# Patient Record
Sex: Male | Born: 1970 | Race: White | Hispanic: No | Marital: Married | Smoking: Never smoker
Health system: Southern US, Community
[De-identification: ages and names within clinical notes are randomized; demographics above are authoritative.]

## PROBLEM LIST (undated history)

## (undated) DIAGNOSIS — I4891 Unspecified atrial fibrillation: Secondary | ICD-10-CM

## (undated) DIAGNOSIS — E119 Type 2 diabetes mellitus without complications: Secondary | ICD-10-CM

## (undated) DIAGNOSIS — G629 Polyneuropathy, unspecified: Secondary | ICD-10-CM

---

## 2021-02-19 ENCOUNTER — Emergency Department (HOSPITAL_BASED_OUTPATIENT_CLINIC_OR_DEPARTMENT_OTHER): Payer: BLUE CROSS/BLUE SHIELD

## 2021-02-19 ENCOUNTER — Encounter (HOSPITAL_BASED_OUTPATIENT_CLINIC_OR_DEPARTMENT_OTHER): Payer: Self-pay | Admitting: *Deleted

## 2021-02-19 ENCOUNTER — Emergency Department (HOSPITAL_BASED_OUTPATIENT_CLINIC_OR_DEPARTMENT_OTHER)
Admission: EM | Admit: 2021-02-19 | Discharge: 2021-02-19 | Disposition: A | Discharge status: Home / Self Care | Payer: BLUE CROSS/BLUE SHIELD | Attending: Emergency Medicine | Admitting: Emergency Medicine

## 2021-02-19 ENCOUNTER — Other Ambulatory Visit: Payer: Self-pay

## 2021-02-19 DIAGNOSIS — I4891 Unspecified atrial fibrillation: Secondary | ICD-10-CM | POA: Insufficient documentation

## 2021-02-19 DIAGNOSIS — W109XXA Fall (on) (from) unspecified stairs and steps, initial encounter: Secondary | ICD-10-CM | POA: Insufficient documentation

## 2021-02-19 DIAGNOSIS — E119 Type 2 diabetes mellitus without complications: Secondary | ICD-10-CM | POA: Diagnosis not present

## 2021-02-19 DIAGNOSIS — S99921A Unspecified injury of right foot, initial encounter: Secondary | ICD-10-CM | POA: Diagnosis present

## 2021-02-19 DIAGNOSIS — S92491A Other fracture of right great toe, initial encounter for closed fracture: Secondary | ICD-10-CM

## 2021-02-19 DIAGNOSIS — W19XXXA Unspecified fall, initial encounter: Secondary | ICD-10-CM

## 2021-02-19 HISTORY — DX: Polyneuropathy, unspecified: G62.9

## 2021-02-19 HISTORY — DX: Unspecified atrial fibrillation: I48.91

## 2021-02-19 HISTORY — DX: Type 2 diabetes mellitus without complications: E11.9

## 2021-02-19 NOTE — ED Provider Notes (Signed)
[facility] EMERGENCY DEPARTMENT Provider Note   CSN: [id number] Arrival date & time: 02/19/21  1526     History Chief Complaint  Patient presents with   Toe Injury    Jonathan Murphy is a 50 y.o. male.  HPI Patient is a 50 year old male with a medical history as noted below.  He presents to the emergency department due to right great toe pain.  Patient states he missed a step and fell out of his truck bed landing on the asphalt.  States that he struck his right great toe when this occurred.  States that he was only mildly hurting initially in the toe but after ambulating on it it continued to worsen so he came to the emergency department.  Denies any head trauma.  States he is not anticoagulated and his son at bedside who is an EMT confirms this.  Denies any other regions of pain.  No numbness or weakness.    Past Medical History:  Diagnosis Date   Atrial fibrillation (HCC)    Diabetes mellitus without complication (HCC)    Neuropathy     There are no problems to display for this patient.   Past Surgical History:  Procedure Laterality Date   CARDIAC ELECTROPHYSIOLOGY MAPPING AND ABLATION         History reviewed. No pertinent family history.  Social History   Tobacco Use   Smoking status: Never   Smokeless tobacco: Never  Vaping Use   Vaping Use: Never used  Substance Use Topics   Alcohol use: Yes    Comment: rare   Drug use: Not Currently    Home Medications Prior to Admission medications   Not on File    Allergies    Patient has no known allergies.  Review of Systems   Review of Systems  Musculoskeletal:  Positive for arthralgias, joint swelling and myalgias.  Skin:  Positive for color change. Negative for wound.  Neurological:  Negative for syncope, weakness, numbness and headaches.   Physical Exam Updated Vital Signs BP (!) 148/91 (BP Location: Right Arm)    Pulse (!) 57    Temp 97.8 F (36.6 C) (Oral)    Resp 16    Ht 5\' 11"  (1.803 m)     Wt 113.4 kg    SpO2 100%    BMI 34.87 kg/m   Physical Exam Vitals and nursing note reviewed.  Constitutional:      General: He is not in acute distress.    Appearance: He is well-developed.  HENT:     Head: Normocephalic and atraumatic.     Right Ear: External ear normal.     Left Ear: External ear normal.  Eyes:     General: No scleral icterus.       Right eye: No discharge.        Left eye: No discharge.     Conjunctiva/sclera: Conjunctivae normal.  Neck:     Trachea: No tracheal deviation.     Comments: No midline C, T, or L-spine tenderness. Cardiovascular:     Rate and Rhythm: Normal rate.  Pulmonary:     Effort: Pulmonary effort is normal. No respiratory distress.     Breath sounds: No stridor.  Abdominal:     General: There is no distension.  Musculoskeletal:        General: Swelling and tenderness present. No deformity.     Cervical back: Normal range of motion and neck supple. No tenderness.     Comments: Moderate  TTP noted circumferentially along the proximal right great toe.  Mild ecchymosis and soft tissue swelling in the region.  No subungual hematoma noted.  Good cap refill.  Distal sensation intact.  Patient wiggling the toe without difficulty.  No tenderness appreciated in the right foot, ankle, knee, or hip.  Full range of motion of the right lower extremity.  Skin:    General: Skin is warm and dry.     Findings: Bruising present. No rash.  Neurological:     Mental Status: He is alert.     Cranial Nerves: Cranial nerve deficit: no gross deficits.   ED Results / Procedures / Treatments   Labs (all labs ordered are listed, but only abnormal results are displayed) Labs Reviewed - No data to display  EKG None  Radiology DG Toe Great Right  Result Date: 02/19/2021 CLINICAL DATA:  Fall EXAM: RIGHT GREAT TOE COMPARISON:  None. FINDINGS: There is an acute nondisplaced fracture of the diaphysis of the great toe metatarsal. No other fracture is seen.  Alignment is normal. There is mild soft tissue swelling. IMPRESSION: Acute nondisplaced fracture through the great toe metatarsal diaphysis. Electronically Signed   By: Lesia Hausen M.D.   On: 02/19/2021 16:18    Procedures Procedures   Medications Ordered in ED Medications - No data to display  ED Course  I have reviewed the triage vital signs and the nursing notes.  Pertinent labs & imaging results that were available during my care of the patient were reviewed by me and considered in my medical decision making (see chart for details).    MDM Rules/Calculators/A&P                          Patient is a 50 year old male who presents to the emergency department due to right great toe pain after a fall that occurred prior to arrival.  My exam patient has soft tissue swelling, ecchymosis, and tenderness to the right great toe.  Neurovascularly intact in the toe.  No other regions of tenderness appreciated.  Patient denies any head trauma or syncope.  No midline spine pain.  X-rays obtained in triage show an acute nondisplaced fracture through the great toe metatarsal diaphysis.  Will place patient in a hard soled shoe and buddy tape the toes.  Recommended Tylenol as well as Motrin for management of his symptoms.  We discussed dosing.  Discussed return precautions.  Recommended PCP follow-up in 1 to 2 weeks if his symptoms have not began to improve.  His questions were answered and he was amicable at the time of discharge.  Final Clinical Impression(s) / ED Diagnoses Final diagnoses:  Other fracture of right great toe, initial encounter for closed fracture   Rx / DC Orders ED Discharge Orders     None        Placido Sou, PA-C 02/19/21 1738    Cheryll Cockayne, MD 02/19/21 1807

## 2021-02-19 NOTE — Discharge Instructions (Signed)
I recommend a combination of tylenol and ibuprofen for management of your pain. You can take a low dose of both at the same time. I recommend 500 mg of Tylenol combined with 600 mg of ibuprofen. This is one maximum strength Tylenol and three regular ibuprofen. You can take these 2-3 times for day for your pain. Please try to take these medications with a small amount of food as well to prevent upsetting your stomach.  Please follow-up with your regular physician in 1 to 2 weeks if you find that your symptoms have not began to improve.  Please continue to monitor your symptoms closely.  If you develop any new or worsening symptoms please come back to the emergency department.

## 2021-02-19 NOTE — ED Triage Notes (Signed)
C/o right big toe injury x 3 hrs ago

## 2023-01-26 IMAGING — DX DG TOE GREAT 2+V*R*
3 series · 3 of 3 positions shown · non-contrast
Comparison: None.

CLINICAL DATA: Fall

EXAM:
RIGHT GREAT TOE

[toe ap]
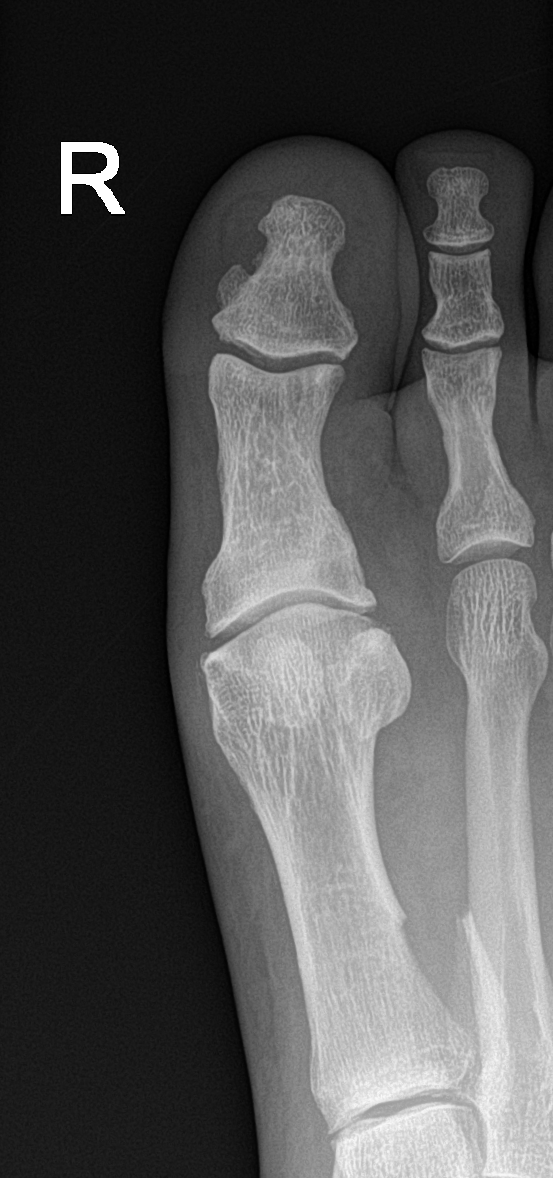

[toe obl]
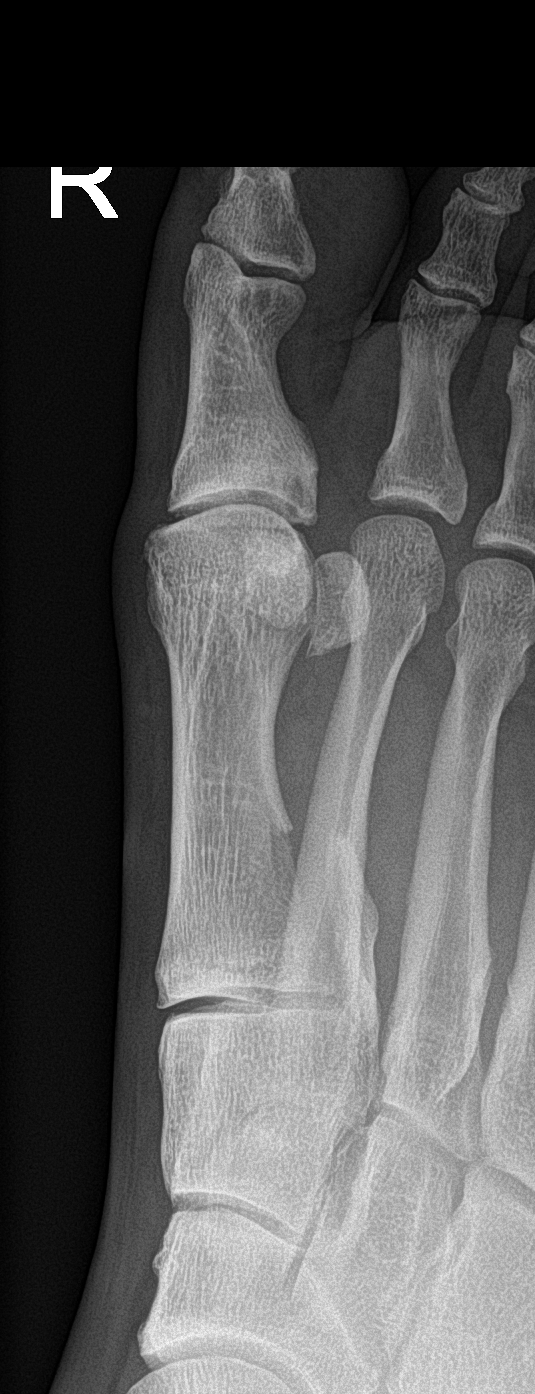

[toe lat]
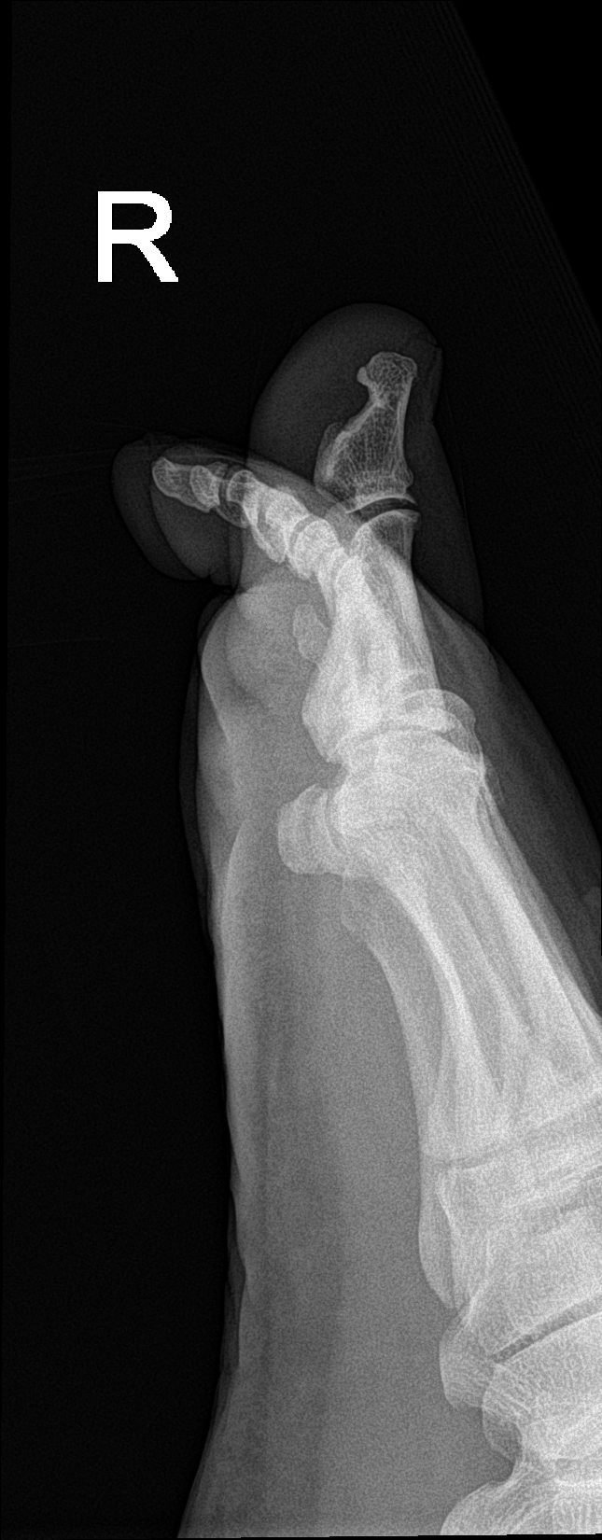

[3 of 3 positions shown; findings below may reference images not displayed]

FINDINGS: There is an acute nondisplaced fracture of the diaphysis of the
great toe metatarsal. No other fracture is seen. Alignment is
normal. There is mild soft tissue swelling.
IMPRESSION: Acute nondisplaced fracture through the great toe metatarsal
diaphysis.
